# Patient Record
Sex: Female | Born: 1995 | Race: White | Hispanic: No | Marital: Single | Smoking: Never smoker
Health system: Southern US, Community
[De-identification: ages and names within clinical notes are randomized; demographics above are authoritative.]

---
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
## 2020-06-14 ENCOUNTER — Other Ambulatory Visit: Payer: Self-pay

## 2020-06-14 ENCOUNTER — Ambulatory Visit
Admission: EM | Admit: 2020-06-14 | Discharge: 2020-06-14 | Disposition: A | Discharge status: Home / Self Care | Payer: 59 | Attending: Emergency Medicine | Admitting: Emergency Medicine

## 2020-06-14 DIAGNOSIS — R1010 Upper abdominal pain, unspecified: Secondary | ICD-10-CM | POA: Insufficient documentation

## 2020-06-14 DIAGNOSIS — R112 Nausea with vomiting, unspecified: Secondary | ICD-10-CM

## 2020-06-14 DIAGNOSIS — Z1152 Encounter for screening for COVID-19: Secondary | ICD-10-CM | POA: Insufficient documentation

## 2020-06-14 LAB — POCT URINE PREGNANCY: Preg Test, Ur: NEGATIVE

## 2020-06-14 LAB — POCT URINALYSIS DIP (MANUAL ENTRY)
Bilirubin, UA: NEGATIVE
Blood, UA: NEGATIVE
Glucose, UA: NEGATIVE mg/dL
Ketones, POC UA: NEGATIVE mg/dL
Nitrite, UA: NEGATIVE
Protein Ur, POC: NEGATIVE mg/dL
Spec Grav, UA: 1.015 (ref 1.010–1.025)
Urobilinogen, UA: 0.2 E.U./dL
pH, UA: 7.5 (ref 5.0–8.0)

## 2020-06-14 MED ORDER — LIDOCAINE VISCOUS HCL 2 % MT SOLN
15.0000 mL | Freq: Once | OROMUCOSAL | Status: AC
  Administered 2020-06-14: 15 mL via ORAL

## 2020-06-14 MED ORDER — ALUM & MAG HYDROXIDE-SIMETH 200-200-20 MG/5ML PO SUSP
30.0000 mL | Freq: Once | ORAL | Status: AC
  Administered 2020-06-14: 30 mL via ORAL

## 2020-06-14 MED ORDER — DICYCLOMINE HCL 20 MG PO TABS: 20.0000 mg | ORAL_TABLET | Freq: Three times a day (TID) | ORAL | 0 refills | Status: AC

## 2020-06-14 MED ORDER — ONDANSETRON 4 MG PO TBDP: 4.0000 mg | ORAL_TABLET | Freq: Three times a day (TID) | ORAL | 0 refills | Status: AC | PRN

## 2020-06-14 NOTE — ED Triage Notes (Addendum)
Patient presents to Urgent Care with complaints of intermittent epigastric pain and nausea since 02/23.   Denies fever, vomiting, diarrhea, or any urinary symptoms.

## 2020-06-14 NOTE — ED Provider Notes (Addendum)
[facility]    CSN: [id number] Arrival date & time: 06/14/20  0917      History   Chief Complaint Chief Complaint  Patient presents with  . Abdominal Pain  . Nausea    HPI Valerie Gillespie is a 25 y.o. female presenting today for evaluation of abdominal pain.  Reports that she has had upper abdominal pain and nausea for approximately 1 week.  Denies associated fevers.  Denies vomiting or diarrhea.  Reports pain constant in mid abdomen but will radiate into epigastrium.  Denies changes in bowel movement, reports daily bowels without straining.  Reports urinary frequency, but this is normal for her, denies any dysuria, incomplete voiding.  Denies vaginal discharge itching or irritation.  Last menstrual cycle was first week February 2022, has copper IUD in place.  She denies any associated URI symptoms.  Denies pain radiating to back or chest.  Denies shortness of breath or chest pain.  Denies sore throat.  Denies history of GI problems.  HPI  History reviewed. No pertinent past medical history.  There are no problems to display for this patient.   History reviewed. No pertinent surgical history.  OB History   No obstetric history on file.      Home Medications    Prior to Admission medications   Medication Sig Start Date End Date Taking? Authorizing Provider  dicyclomine (BENTYL) 20 MG tablet Take 1 tablet (20 mg total) by mouth 4 (four) times daily -  before meals and at bedtime. 06/14/20  Yes Terricka Onofrio C, PA-C  ondansetron (ZOFRAN ODT) 4 MG disintegrating tablet Take 1 tablet (4 mg total) by mouth every 8 (eight) hours as needed for nausea or vomiting. 06/14/20  Yes Chelisa Hennen, [place] C, PA-C    Family History Family History  Problem Relation Age of Onset  . Healthy Mother   . Healthy Father     Social History Social History   Tobacco Use  . Smoking status: Never Smoker  . Smokeless tobacco: Never Used  Vaping Use  . Vaping Use: Every day   Substance Use Topics  . Alcohol use: Never  . Drug use: Never     Allergies   Patient has no known allergies.   Review of Systems Review of Systems  Constitutional: Negative for activity change, appetite change, chills, fatigue and fever.  HENT: Negative for congestion, ear pain, rhinorrhea, sinus pressure, sore throat and trouble swallowing.   Eyes: Negative for discharge and redness.  Respiratory: Negative for cough, chest tightness and shortness of breath.   Cardiovascular: Negative for chest pain.  Gastrointestinal: Positive for abdominal pain and nausea. Negative for diarrhea and vomiting.  Genitourinary: Positive for frequency.  Musculoskeletal: Negative for myalgias.  Skin: Negative for rash.  Neurological: Negative for dizziness, light-headedness and headaches.     Physical Exam Triage Vital Signs ED Triage Vitals  Enc Vitals Group     BP 06/14/20 0930 112/77     Pulse Rate 06/14/20 0930 (S) (!) 108     Resp 06/14/20 0930 16     Temp 06/14/20 0930 98.2 F (36.8 C)     Temp Source 06/14/20 0930 Temporal     SpO2 06/14/20 0930 98 %     Weight --      Height --      Head Circumference --      Peak Flow --      Pain Score 06/14/20 0925 6     Pain Loc --  Pain Edu? --      Excl. in GC? --    No data found.  Updated Vital Signs BP 112/77 (BP Location: Left Arm)   Pulse (S) (!) 108   Temp 98.2 F (36.8 C) (Temporal)   Resp 16   SpO2 98%   Visual Acuity Right Eye Distance:   Left Eye Distance:   Bilateral Distance:    Right Eye Near:   Left Eye Near:    Bilateral Near:     Physical Exam Vitals and nursing note reviewed.  Constitutional:      Appearance: She is well-developed and well-nourished.     Comments: No acute distress  HENT:     Head: Normocephalic and atraumatic.     Nose: Nose normal.     Mouth/Throat:     Comments: Oral mucosa pink and moist, no tonsillar enlargement or exudate. Posterior pharynx patent and nonerythematous, no  uvula deviation or swelling. Normal phonation. Eyes:     Conjunctiva/sclera: Conjunctivae normal.  Cardiovascular:     Rate and Rhythm: Normal rate and regular rhythm.  Pulmonary:     Effort: Pulmonary effort is normal. No respiratory distress.     Comments: Breathing comfortably at rest, CTABL, no wheezing, rales or other adventitious sounds auscultated Abdominal:     General: There is no distension.     Comments: Soft, nondistended, nontender to palpation to bilateral lower quadrants, tender to palpation to mid upper abdomen and epigastrium, negative rebound, negative Rovsing, negative McBurney's, negative Murphy's  Musculoskeletal:        General: Normal range of motion.     Cervical back: Neck supple.  Skin:    General: Skin is warm and dry.  Neurological:     Mental Status: She is alert and oriented to person, place, and time.  Psychiatric:        Mood and Affect: Mood and affect normal.      UC Treatments / Results  Labs (all labs ordered are listed, but only abnormal results are displayed) Labs Reviewed  POCT URINALYSIS DIP (MANUAL ENTRY) - Abnormal; Notable for the following components:      Result Value   Clarity, UA cloudy (*)    Leukocytes, UA Large (3+) (*)    All other components within normal limits  NOVEL CORONAVIRUS, NAA  URINE CULTURE  CBC WITH DIFFERENTIAL/PLATELET  COMPREHENSIVE METABOLIC PANEL  LIPASE  POCT URINE PREGNANCY    EKG   Radiology No results found.  Procedures Procedures (including critical care time)  Medications Ordered in UC Medications  alum & mag hydroxide-simeth (MAALOX/MYLANTA) 200-200-20 MG/5ML suspension 30 mL (30 mLs Oral Given 06/14/20 1008)    And  lidocaine (XYLOCAINE) 2 % viscous mouth solution 15 mL (15 mLs Oral Given 06/14/20 1008)    Initial Impression / Assessment and Plan / UC Course  I have reviewed the triage vital signs and the nursing notes.  Pertinent labs & imaging results that were available during my  care of the patient were reviewed by me and considered in my medical decision making (see chart for details).     UA with large leuks, otherwise unremarkable, may be from her baseline discharge, will send for culture to further rule out UTI.  GI cocktail provided without significant improvement of discomfort in stomach-less likely GERD/gastritis.  Will check blood work of CBC, CMP and lipase for further evaluation of abdominal pain.  Will treat symptomatically and supportively in the meantime with Zofran for nausea as well as Bentyl  for trial of abdominal pain/cramping.  Advised to continue to monitor and return for reevaluation as needed.  Patient to go to emergency room if pain worsening.  Discussed strict return precautions. Patient verbalized understanding and is agreeable with plan.  Final Clinical Impressions(s) / UC Diagnoses   Final diagnoses:  Encounter for screening for COVID-19  Pain of upper abdomen  Non-intractable vomiting with nausea, unspecified vomiting type     Discharge Instructions     Blood work pending Use Zofran dissolved in mouth as needed for nausea Try Bentyl before meals at bedtime to help with pain/cramping If any symptoms changing or worsening please follow-up    ED Prescriptions    Medication Sig Dispense Auth. Provider   ondansetron (ZOFRAN ODT) 4 MG disintegrating tablet Take 1 tablet (4 mg total) by mouth every 8 (eight) hours as needed for nausea or vomiting. 20 tablet Alaijah Gibler C, PA-C   dicyclomine (BENTYL) 20 MG tablet Take 1 tablet (20 mg total) by mouth 4 (four) times daily -  before meals and at bedtime. 24 tablet Iyah Laguna, [place] C, PA-C     PDMP not reviewed this encounter.   Sharyon Cable [place] C, PA-C 06/14/20 1057    Khiry Pasquariello, [place] C, [place] 06/14/20 1057

## 2020-06-14 NOTE — Discharge Instructions (Addendum)
Blood work pending Use Zofran dissolved in mouth as needed for nausea Try Bentyl before meals at bedtime to help with pain/cramping If any symptoms changing or worsening please follow-up

## 2020-06-15 LAB — CBC WITH DIFFERENTIAL/PLATELET
Basophils Absolute: 0.1 10*3/uL (ref 0.0–0.2)
Basos: 1 %
EOS (ABSOLUTE): 0.1 10*3/uL (ref 0.0–0.4)
Eos: 1 %
Hematocrit: 46.7 % — ABNORMAL HIGH (ref 34.0–46.6)
Hemoglobin: 15.7 g/dL (ref 11.1–15.9)
Immature Grans (Abs): 0 10*3/uL (ref 0.0–0.1)
Immature Granulocytes: 0 %
Lymphocytes Absolute: 1.2 10*3/uL (ref 0.7–3.1)
Lymphs: 18 %
MCH: 30.5 pg (ref 26.6–33.0)
MCHC: 33.6 g/dL (ref 31.5–35.7)
MCV: 91 fL (ref 79–97)
Monocytes Absolute: 0.4 10*3/uL (ref 0.1–0.9)
Monocytes: 6 %
Neutrophils Absolute: 4.8 10*3/uL (ref 1.4–7.0)
Neutrophils: 74 %
Platelets: 221 10*3/uL (ref 150–450)
RBC: 5.14 x10E6/uL (ref 3.77–5.28)
RDW: 12.4 % (ref 11.7–15.4)
WBC: 6.5 10*3/uL (ref 3.4–10.8)

## 2020-06-15 LAB — COMPREHENSIVE METABOLIC PANEL
ALT: 11 IU/L (ref 0–32)
AST: 17 IU/L (ref 0–40)
Albumin/Globulin Ratio: 2.1 (ref 1.2–2.2)
Albumin: 5.6 g/dL — ABNORMAL HIGH (ref 3.9–5.0)
Alkaline Phosphatase: 67 IU/L (ref 44–121)
BUN/Creatinine Ratio: 9 (ref 9–23)
BUN: 7 mg/dL (ref 6–20)
Bilirubin Total: 0.5 mg/dL (ref 0.0–1.2)
CO2: 21 mmol/L (ref 20–29)
Calcium: 10.3 mg/dL — ABNORMAL HIGH (ref 8.7–10.2)
Chloride: 103 mmol/L (ref 96–106)
Creatinine, Ser: 0.78 mg/dL (ref 0.57–1.00)
Globulin, Total: 2.7 g/dL (ref 1.5–4.5)
Glucose: 71 mg/dL (ref 65–99)
Potassium: 4.2 mmol/L (ref 3.5–5.2)
Sodium: 140 mmol/L (ref 134–144)
Total Protein: 8.3 g/dL (ref 6.0–8.5)
eGFR: 108 mL/min/{1.73_m2} (ref >59)

## 2020-06-15 LAB — URINE CULTURE: Culture: <10000 — AB

## 2020-06-15 LAB — LIPASE: Lipase: 43 U/L (ref 14–72)

## 2020-06-15 LAB — NOVEL CORONAVIRUS, NAA: SARS-CoV-2, NAA: NOT DETECTED

## 2020-06-15 LAB — SARS-COV-2, NAA 2 DAY TAT

## 2020-10-22 ENCOUNTER — Other Ambulatory Visit: Payer: Self-pay | Admitting: Physician Assistant

## 2020-10-22 ENCOUNTER — Other Ambulatory Visit (HOSPITAL_COMMUNITY)
Admission: RE | Admit: 2020-10-22 | Discharge: 2020-10-22 | Disposition: A | Discharge status: Home / Self Care | Payer: 59 | Source: Ambulatory Visit | Attending: Physician Assistant | Admitting: Physician Assistant

## 2020-10-22 DIAGNOSIS — Z124 Encounter for screening for malignant neoplasm of cervix: Secondary | ICD-10-CM | POA: Diagnosis present

## 2020-10-26 LAB — CYTOLOGY - PAP
Adequacy: ABSENT
Comment: NEGATIVE
Diagnosis: NEGATIVE
High risk HPV: NEGATIVE

## 2022-01-11 DIAGNOSIS — R636 Underweight: Secondary | ICD-10-CM | POA: Diagnosis not present

## 2022-01-11 DIAGNOSIS — Z0000 Encounter for general adult medical examination without abnormal findings: Secondary | ICD-10-CM | POA: Diagnosis not present

## 2022-01-11 DIAGNOSIS — G4700 Insomnia, unspecified: Secondary | ICD-10-CM | POA: Diagnosis not present

## 2022-01-11 DIAGNOSIS — E559 Vitamin D deficiency, unspecified: Secondary | ICD-10-CM | POA: Diagnosis not present

## 2022-01-11 DIAGNOSIS — Z13220 Encounter for screening for lipoid disorders: Secondary | ICD-10-CM | POA: Diagnosis not present

## 2022-01-11 DIAGNOSIS — R630 Anorexia: Secondary | ICD-10-CM | POA: Diagnosis not present

## 2022-01-11 DIAGNOSIS — F419 Anxiety disorder, unspecified: Secondary | ICD-10-CM | POA: Diagnosis not present

## 2022-01-11 DIAGNOSIS — Z23 Encounter for immunization: Secondary | ICD-10-CM | POA: Diagnosis not present

## 2022-02-16 DIAGNOSIS — G4719 Other hypersomnia: Secondary | ICD-10-CM | POA: Diagnosis not present

## 2022-02-16 DIAGNOSIS — G4700 Insomnia, unspecified: Secondary | ICD-10-CM | POA: Diagnosis not present

## 2022-03-18 DIAGNOSIS — G4710 Hypersomnia, unspecified: Secondary | ICD-10-CM | POA: Diagnosis not present

## 2022-03-20 DIAGNOSIS — G4719 Other hypersomnia: Secondary | ICD-10-CM | POA: Diagnosis not present

## 2022-06-15 DIAGNOSIS — G4719 Other hypersomnia: Secondary | ICD-10-CM | POA: Diagnosis not present

## 2022-09-19 DIAGNOSIS — G4719 Other hypersomnia: Secondary | ICD-10-CM | POA: Diagnosis not present

## 2022-09-19 DIAGNOSIS — G4700 Insomnia, unspecified: Secondary | ICD-10-CM | POA: Diagnosis not present

## 2023-01-18 DIAGNOSIS — E559 Vitamin D deficiency, unspecified: Secondary | ICD-10-CM | POA: Diagnosis not present

## 2023-01-18 DIAGNOSIS — Z0000 Encounter for general adult medical examination without abnormal findings: Secondary | ICD-10-CM | POA: Diagnosis not present

## 2023-01-18 DIAGNOSIS — Z130 Encounter for screening for diseases of the blood and blood-forming organs and certain disorders involving the immune mechanism: Secondary | ICD-10-CM | POA: Diagnosis not present

## 2023-01-18 DIAGNOSIS — Z23 Encounter for immunization: Secondary | ICD-10-CM | POA: Diagnosis not present

## 2023-01-18 DIAGNOSIS — R5383 Other fatigue: Secondary | ICD-10-CM | POA: Diagnosis not present

## 2023-01-18 DIAGNOSIS — Z13220 Encounter for screening for lipoid disorders: Secondary | ICD-10-CM | POA: Diagnosis not present

## 2023-01-26 DIAGNOSIS — K529 Noninfective gastroenteritis and colitis, unspecified: Secondary | ICD-10-CM | POA: Diagnosis not present

## 2023-01-26 DIAGNOSIS — F419 Anxiety disorder, unspecified: Secondary | ICD-10-CM | POA: Diagnosis not present

## 2023-01-26 DIAGNOSIS — R634 Abnormal weight loss: Secondary | ICD-10-CM | POA: Diagnosis not present

## 2023-01-26 DIAGNOSIS — G4700 Insomnia, unspecified: Secondary | ICD-10-CM | POA: Diagnosis not present

## 2023-11-13 DIAGNOSIS — Z30432 Encounter for removal of intrauterine contraceptive device: Secondary | ICD-10-CM | POA: Diagnosis not present

## 2023-11-13 DIAGNOSIS — Z01419 Encounter for gynecological examination (general) (routine) without abnormal findings: Secondary | ICD-10-CM | POA: Diagnosis not present

## 2023-11-13 DIAGNOSIS — Z124 Encounter for screening for malignant neoplasm of cervix: Secondary | ICD-10-CM | POA: Diagnosis not present

## 2024-01-22 DIAGNOSIS — Z23 Encounter for immunization: Secondary | ICD-10-CM | POA: Diagnosis not present

## 2024-01-22 DIAGNOSIS — Z3200 Encounter for pregnancy test, result unknown: Secondary | ICD-10-CM | POA: Diagnosis not present

## 2024-02-18 DIAGNOSIS — N911 Secondary amenorrhea: Secondary | ICD-10-CM | POA: Diagnosis not present

## 2024-02-27 DIAGNOSIS — Z3481 Encounter for supervision of other normal pregnancy, first trimester: Secondary | ICD-10-CM | POA: Diagnosis not present

## 2024-02-27 DIAGNOSIS — Z3A09 9 weeks gestation of pregnancy: Secondary | ICD-10-CM | POA: Diagnosis not present

## 2024-02-27 DIAGNOSIS — Z3685 Encounter for antenatal screening for Streptococcus B: Secondary | ICD-10-CM | POA: Diagnosis not present

## 2024-02-27 DIAGNOSIS — Z3401 Encounter for supervision of normal first pregnancy, first trimester: Secondary | ICD-10-CM | POA: Diagnosis not present

## 2024-03-03 DIAGNOSIS — Z113 Encounter for screening for infections with a predominantly sexual mode of transmission: Secondary | ICD-10-CM | POA: Diagnosis not present

## 2024-03-03 DIAGNOSIS — Z3400 Encounter for supervision of normal first pregnancy, unspecified trimester: Secondary | ICD-10-CM | POA: Diagnosis not present

## 2024-03-03 DIAGNOSIS — Z3A10 10 weeks gestation of pregnancy: Secondary | ICD-10-CM | POA: Diagnosis not present

## 2024-04-08 DIAGNOSIS — Z361 Encounter for antenatal screening for raised alphafetoprotein level: Secondary | ICD-10-CM | POA: Diagnosis not present
